# Patient Record
Sex: Male | Born: 1980
Health system: Southern US, Community
[De-identification: ages and names within clinical notes are randomized; demographics above are authoritative.]

## PROBLEM LIST (undated history)

## (undated) DIAGNOSIS — R569 Unspecified convulsions: Secondary | ICD-10-CM

## (undated) HISTORY — PX: GASTRIC BYPASS: SHX52

---

## 2013-03-28 ENCOUNTER — Emergency Department (HOSPITAL_COMMUNITY)
Admission: EM | Admit: 2013-03-28 | Discharge: 2013-03-28 | Disposition: A | Discharge status: Home / Self Care | Payer: No Typology Code available for payment source | Attending: Emergency Medicine | Admitting: Emergency Medicine

## 2013-03-28 ENCOUNTER — Encounter (HOSPITAL_COMMUNITY): Payer: Self-pay | Admitting: Emergency Medicine

## 2013-03-28 ENCOUNTER — Emergency Department (HOSPITAL_COMMUNITY): Payer: No Typology Code available for payment source

## 2013-03-28 DIAGNOSIS — S93409A Sprain of unspecified ligament of unspecified ankle, initial encounter: Secondary | ICD-10-CM | POA: Insufficient documentation

## 2013-03-28 DIAGNOSIS — Y9389 Activity, other specified: Secondary | ICD-10-CM | POA: Insufficient documentation

## 2013-03-28 DIAGNOSIS — Y92410 Unspecified street and highway as the place of occurrence of the external cause: Secondary | ICD-10-CM | POA: Insufficient documentation

## 2013-03-28 DIAGNOSIS — Z880 Allergy status to penicillin: Secondary | ICD-10-CM | POA: Insufficient documentation

## 2013-03-28 DIAGNOSIS — S80211A Abrasion, right knee, initial encounter: Secondary | ICD-10-CM

## 2013-03-28 DIAGNOSIS — IMO0002 Reserved for concepts with insufficient information to code with codable children: Secondary | ICD-10-CM | POA: Insufficient documentation

## 2013-03-28 DIAGNOSIS — Z8669 Personal history of other diseases of the nervous system and sense organs: Secondary | ICD-10-CM | POA: Insufficient documentation

## 2013-03-28 DIAGNOSIS — Z23 Encounter for immunization: Secondary | ICD-10-CM | POA: Insufficient documentation

## 2013-03-28 HISTORY — DX: Unspecified convulsions: R56.9

## 2013-03-28 MED ORDER — IBUPROFEN 600 MG PO TABS: 600.0000 mg | ORAL_TABLET | Freq: Four times a day (QID) | ORAL | Status: DC | PRN

## 2013-03-28 MED ORDER — HYDROCODONE-ACETAMINOPHEN 5-325 MG PO TABS: 1.0000 | ORAL_TABLET | Freq: Four times a day (QID) | ORAL | Status: DC | PRN

## 2013-03-28 MED ORDER — TETANUS-DIPHTH-ACELL PERTUSSIS 5-2.5-18.5 LF-MCG/0.5 IM SUSP
0.5000 mL | Freq: Once | INTRAMUSCULAR | Status: AC
  Administered 2013-03-28: 0.5 mL via INTRAMUSCULAR
  Filled 2013-03-28: qty 0.5
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
## 2013-03-28 NOTE — ED Provider Notes (Signed)
Medical screening examination/treatment/procedure(s) were performed by non-physician practitioner and as supervising physician I was immediately available for consultation/collaboration.  EKG Interpretation   None        Richell Corker T Klyn Kroening, MD 03/28/13 2329 
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
## 2013-03-28 NOTE — Discharge Instructions (Signed)
Abrasion An abrasion is a cut or scrape of the skin. Abrasions do not extend through all layers of the skin and most heal within 10 days. It is important to care for your abrasion properly to prevent infection. CAUSES  Most abrasions are caused by falling on, or gliding across, the ground or other surface. When your skin rubs on something, the outer and inner layer of skin rubs off, causing an abrasion. DIAGNOSIS  Your caregiver will be able to diagnose an abrasion during a physical exam.  TREATMENT  Your treatment depends on how large and deep the abrasion is. Generally, your abrasion will be cleaned with water and a mild soap to remove any dirt or debris. An antibiotic ointment may be put over the abrasion to prevent an infection. A bandage (dressing) may be wrapped around the abrasion to keep it from getting dirty.  You may need a tetanus shot if:  You cannot remember when you had your last tetanus shot.  You have never had a tetanus shot.  The injury broke your skin. If you get a tetanus shot, your arm may swell, get red, and feel warm to the touch. This is common and not a problem. If you need a tetanus shot and you choose not to have one, there is a rare chance of getting tetanus. Sickness from tetanus can be serious.  HOME CARE INSTRUCTIONS   If a dressing was applied, change it at least once a day or as directed by your caregiver. If the bandage sticks, soak it off with warm water.   Wash the area with water and a mild soap to remove all the ointment 2 times a day. Rinse off the soap and pat the area dry with a clean towel.   Reapply any ointment as directed by your caregiver. This will help prevent infection and keep the bandage from sticking. Use gauze over the wound and under the dressing to help keep the bandage from sticking.   Change your dressing right away if it becomes wet or dirty.   Only take over-the-counter or prescription medicines for pain, discomfort, or fever as  directed by your caregiver.   Follow up with your caregiver within 24 48 hours for a wound check, or as directed. If you were not given a wound-check appointment, look closely at your abrasion for redness, swelling, or pus. These are signs of infection. SEEK IMMEDIATE MEDICAL CARE IF:   You have increasing pain in the wound.   You have redness, swelling, or tenderness around the wound.   You have pus coming from the wound.   You have a fever or persistent symptoms for more than 2 3 days.  You have a fever and your symptoms suddenly get worse.  You have a bad smell coming from the wound or dressing.  MAKE SURE YOU:   Understand these instructions.  Will watch your condition.  Will get help right away if you are not doing well or get worse. Document Released: 11/14/2004 Document Revised: 01/22/2012 Document Reviewed: 01/08/2011 [facility] Patient Information 2014 [place], [place].  Acute Ankle Sprain with Phase I Rehab An acute ankle sprain is a partial or complete tear in one or more of the ligaments of the ankle due to traumatic injury. The severity of the injury depends on both the the number of ligaments sprained and the grade of sprain. There are 3 grades of sprains.   A grade 1 sprain is a mild sprain. There is a slight pull without  obvious tearing. There is no loss of strength, and the muscle and ligament are the correct length.  A grade 2 sprain is a moderate sprain. There is tearing of fibers within the substance of the ligament where it connects two bones or two cartilages. The length of the ligament is increased, and there is usually decreased strength.  A grade 3 sprain is a complete rupture of the ligament and is uncommon. In addition to the grade of sprain, there are three types of ankle sprains.  Lateral ankle sprains: This is a sprain of one or more of the three ligaments on the outer side (lateral) of the ankle. These are the most common sprains. Medial ankle  sprains: There is one large triangular ligament of the inner side (medial) of the ankle that is susceptible to injury. Medial ankle sprains are less common. Syndesmosis, "high ankle," sprains: The syndesmosis is the ligament that connects the two bones of the lower leg. Syndesmosis sprains usually only occur with very severe ankle sprains. SYMPTOMS  Pain, tenderness, and swelling in the ankle, starting at the side of injury that may progress to the whole ankle and foot with time.  "Pop" or tearing sensation at the time of injury.  Bruising that may spread to the heel.  Impaired ability to walk soon after injury. CAUSES   Acute ankle sprains are caused by trauma placed on the ankle that temporarily forces or pries the anklebone (talus) out of its normal socket.  Stretching or tearing of the ligaments that normally hold the joint in place (usually due to a twisting injury). RISK INCREASES WITH:  Previous ankle sprain.  Sports in which the foot may land awkwardly (ie. basketball, volleyball, or soccer) or walking or running on uneven or rough surfaces.  Shoes with inadequate support to prevent sideways motion when stress occurs.  Poor strength and flexibility.  Poor balance skills.  Contact sports. PREVENTION   Warm up and stretch properly before activity.  Maintain physical fitness:  Ankle and leg flexibility, muscle strength, and endurance.  Cardiovascular fitness.  Balance training activities.  Use proper technique and have a coach correct improper technique.  Taping, protective strapping, bracing, or high-top tennis shoes may help prevent injury. Initially, tape is best; however, it loses most of its support function within 10 to 15 minutes.  Wear proper fitted protective shoes (High-top shoes with taping or bracing is more effective than either alone).  Provide the ankle with support during sports and practice activities for 12 months following injury. PROGNOSIS    If treated properly, ankle sprains can be expected to recover completely; however, the length of recovery depends on the degree of injury.  A grade 1 sprain usually heals enough in 5 to 7 days to allow modified activity and requires an average of 6 weeks to heal completely.  A grade 2 sprain requires 6 to 10 weeks to heal completely.  A grade 3 sprain requires 12 to 16 weeks to heal.  A syndesmosis sprain often takes more than 3 months to heal. RELATED COMPLICATIONS   Frequent recurrence of symptoms may result in a chronic problem. Appropriately addressing the problem the first time decreases the frequency of recurrence and optimizes healing time. Severity of the initial sprain does not predict the likelihood of later instability.  Injury to other structures (bone, cartilage, or tendon).  A chronically unstable or arthritic ankle joint is a possiblity with repeated sprains. TREATMENT Treatment initially involves the use of ice, medication, and compression bandages  to help reduce pain and inflammation. Ankle sprains are usually immobilized in a walking cast or boot to allow for healing. Crutches may be recommended to reduce pressure on the injury. After immobilization, strengthening and stretching exercises may be necessary to regain strength and a full range of motion. Surgery is rarely needed to treat ankle sprains. MEDICATION   Nonsteroidal anti-inflammatory medications, such as aspirin and ibuprofen (do not take for the first 3 days after injury or within 7 days before surgery), or other minor pain relievers, such as acetaminophen, are often recommended. Take these as directed by your caregiver. Contact your caregiver immediately if any bleeding, stomach upset, or signs of an allergic reaction occur from these medications.  Ointments applied to the skin may be helpful.  Pain relievers may be prescribed as necessary by your caregiver. Do not take prescription pain medication for longer  than 4 to 7 days. Use only as directed and only as much as you need. HEAT AND COLD  Cold treatment (icing) is used to relieve pain and reduce inflammation for acute and chronic cases. Cold should be applied for 10 to 15 minutes every 2 to 3 hours for inflammation and pain and immediately after any activity that aggravates your symptoms. Use ice packs or an ice massage.  Heat treatment may be used before performing stretching and strengthening activities prescribed by your caregiver. Use a heat pack or a warm soak. SEEK IMMEDIATE MEDICAL CARE IF:   Pain, swelling, or bruising worsens despite treatment.  You experience pain, numbness, discoloration, or coldness in the foot or toes.  New, unexplained symptoms develop (drugs used in treatment may produce side effects.) EXERCISES  PHASE I EXERCISES RANGE OF MOTION (ROM) AND STRETCHING EXERCISES - Ankle Sprain, Acute Phase I, Weeks 1 to 2 These exercises may help you when beginning to restore flexibility in your ankle. You will likely work on these exercises for the 1 to 2 weeks after your injury. Once your physician, physical therapist, or athletic trainer sees adequate progress, he or she will advance your exercises. While completing these exercises, remember:   Restoring tissue flexibility helps normal motion to return to the joints. This allows healthier, less painful movement and activity.  An effective stretch should be held for at least 30 seconds.  A stretch should never be painful. You should only feel a gentle lengthening or release in the stretched tissue. RANGE OF MOTION - Dorsi/Plantar Flexion  While sitting with your right / left knee straight, draw the top of your foot upwards by flexing your ankle. Then reverse the motion, pointing your toes downward.  Hold each position for __________ seconds.  After completing your first set of exercises, repeat this exercise with your knee bent. Repeat __________ times. Complete this  exercise __________ times per day.  RANGE OF MOTION - Ankle Alphabet  Imagine your right / left big toe is a pen.  Keeping your hip and knee still, write out the entire alphabet with your "pen." Make the letters as large as you can without increasing any discomfort. Repeat __________ times. Complete this exercise __________ times per day.  STRENGTHENING EXERCISES - Ankle Sprain, Acute -Phase I, Weeks 1 to 2 These exercises may help you when beginning to restore strength in your ankle. You will likely work on these exercises for 1 to 2 weeks after your injury. Once your physician, physical therapist, or athletic trainer sees adequate progress, he or she will advance your exercises. While completing these exercises, remember:  Muscles can gain both the endurance and the strength needed for everyday activities through controlled exercises.  Complete these exercises as instructed by your physician, physical therapist, or athletic trainer. Progress the resistance and repetitions only as guided.  You may experience muscle soreness or fatigue, but the pain or discomfort you are trying to eliminate should never worsen during these exercises. If this pain does worsen, stop and make certain you are following the directions exactly. If the pain is still present after adjustments, discontinue the exercise until you can discuss the trouble with your clinician. STRENGTH - Dorsiflexors  Secure a rubber exercise band/tubing to a fixed object (ie. table, pole) and loop the other end around your right / left foot.  Sit on the floor facing the fixed object. The band/tubing should be slightly tense when your foot is relaxed.  Slowly draw your foot back toward you using your ankle and toes.  Hold this position for __________ seconds. Slowly release the tension in the band and return your foot to the starting position. Repeat __________ times. Complete this exercise __________ times per day.  STRENGTH -  Plantar-flexors   Sit with your right / left leg extended. Holding onto both ends of a rubber exercise band/tubing, loop it around the ball of your foot. Keep a slight tension in the band.  Slowly push your toes away from you, pointing them downward.  Hold this position for __________ seconds. Return slowly, controlling the tension in the band/tubing. Repeat __________ times. Complete this exercise __________ times per day.  STRENGTH - Ankle Eversion  Secure one end of a rubber exercise band/tubing to a fixed object (table, pole). Loop the other end around your foot just before your toes.  Place your fists between your knees. This will focus your strengthening at your ankle.  Drawing the band/tubing across your opposite foot, slowly, pull your little toe out and up. Make sure the band/tubing is positioned to resist the entire motion.  Hold this position for __________ seconds. Have your muscles resist the band/tubing as it slowly pulls your foot back to the starting position.  Repeat __________ times. Complete this exercise __________ times per day.  STRENGTH - Ankle Inversion  Secure one end of a rubber exercise band/tubing to a fixed object (table, pole). Loop the other end around your foot just before your toes.  Place your fists between your knees. This will focus your strengthening at your ankle.  Slowly, pull your big toe up and in, making sure the band/tubing is positioned to resist the entire motion.  Hold this position for __________ seconds.  Have your muscles resist the band/tubing as it slowly pulls your foot back to the starting position. Repeat __________ times. Complete this exercises __________ times per day.  STRENGTH - Towel Curls  Sit in a chair positioned on a non-carpeted surface.  Place your right / left foot on a towel, keeping your heel on the floor.  Pull the towel toward your heel by only curling your toes. Keep your heel on the floor.  If instructed  by your physician, physical therapist, or athletic trainer, add weight to the end of the towel. Repeat ______5____ times. Complete this exercise ___2_______ times per day. Document Released: 09/05/2004 Document Revised: 04/29/2011 Document Reviewed: 05/19/2008 [facility] Patient Information 2014 [place], [place]. No fracture seen on xray
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
## 2016-02-05 DIAGNOSIS — R002 Palpitations: Secondary | ICD-10-CM | POA: Diagnosis not present

## 2016-02-06 DIAGNOSIS — R002 Palpitations: Secondary | ICD-10-CM | POA: Diagnosis not present

## 2016-02-07 DIAGNOSIS — R002 Palpitations: Secondary | ICD-10-CM | POA: Diagnosis not present

## 2016-02-07 DIAGNOSIS — Z6838 Body mass index (BMI) 38.0-38.9, adult: Secondary | ICD-10-CM | POA: Diagnosis not present

## 2017-02-12 ENCOUNTER — Other Ambulatory Visit: Payer: Self-pay

## 2017-02-12 ENCOUNTER — Encounter (HOSPITAL_BASED_OUTPATIENT_CLINIC_OR_DEPARTMENT_OTHER): Payer: Self-pay

## 2017-02-12 DIAGNOSIS — M549 Dorsalgia, unspecified: Secondary | ICD-10-CM | POA: Diagnosis not present

## 2017-02-12 DIAGNOSIS — R0602 Shortness of breath: Secondary | ICD-10-CM | POA: Diagnosis not present

## 2017-02-12 NOTE — ED Triage Notes (Signed)
Pt c/o left upper back pain worsening for over a week that is worse with movement, has not tried anything for pain

## 2017-02-13 ENCOUNTER — Emergency Department (HOSPITAL_BASED_OUTPATIENT_CLINIC_OR_DEPARTMENT_OTHER)
Admission: EM | Admit: 2017-02-13 | Discharge: 2017-02-13 | Disposition: A | Discharge status: Home / Self Care | Payer: BLUE CROSS/BLUE SHIELD | Attending: Emergency Medicine | Admitting: Emergency Medicine

## 2017-02-13 ENCOUNTER — Emergency Department (HOSPITAL_BASED_OUTPATIENT_CLINIC_OR_DEPARTMENT_OTHER): Payer: BLUE CROSS/BLUE SHIELD

## 2017-02-13 DIAGNOSIS — M549 Dorsalgia, unspecified: Secondary | ICD-10-CM

## 2017-02-13 DIAGNOSIS — R0602 Shortness of breath: Secondary | ICD-10-CM | POA: Diagnosis not present

## 2017-02-13 MED ORDER — NAPROXEN 250 MG PO TABS
500.0000 mg | ORAL_TABLET | Freq: Once | ORAL | Status: AC
  Administered 2017-02-13: 500 mg via ORAL
  Filled 2017-02-13: qty 2

## 2017-02-13 MED ORDER — NAPROXEN 500 MG PO TABS: 500.0000 mg | ORAL_TABLET | Freq: Two times a day (BID) | ORAL | 0 refills | Status: DC | PRN

## 2017-02-13 NOTE — ED Notes (Signed)
Pt c/o left shoulder blade pain x 9 days  Pain increased w movement and deep breaths

## 2017-02-13 NOTE — ED Provider Notes (Signed)
[facility] Provider Note: Jonathan DellJ. Lane [name], MD, FACEP  CSN: [id number] MRN: [id number] ARRIVAL: 02/12/17 at 2301 ROOM: [facility]03/[facility]03   CHIEF COMPLAINT  Back Pain   HISTORY OF PRESENT ILLNESS  02/13/17 12:40 AM Jonathan Fuentes is a 36 y.o. male with a one-week history of pain in his left upper back.  He describes the pain as feeling like a pulled muscle.  He rates his pain as a 4 out of 10.  It is worse with deep breathing or with movements of his left upper back.  He denies recent trauma or injury.  He has not taken anything for the pain.  He states he has difficulty finding a comfortable position to sleep in.  [facility] [place] reveals the patient has received no opioid prescriptions in the past year.   Past Medical History:  Diagnosis Date  . Seizures (HCC)    last one was in 2010    History reviewed. No pertinent surgical history.  No family history on file.  Social History   Tobacco Use  . Smoking status: Never Smoker  . Smokeless tobacco: Never Used  Substance Use Topics  . Alcohol use: Yes    Comment: socially  . Drug use: No    Prior to Admission medications   Medication Sig Start Date End Date Taking? Authorizing Provider  HYDROcodone-acetaminophen (NORCO/VICODIN) 5-325 MG per tablet Take 1 tablet by mouth every 6 (six) hours as needed. 03/28/13   Earley FavorSchulz, Gail, NP  ibuprofen (ADVIL,MOTRIN) 600 MG tablet Take 1 tablet (600 mg total) by mouth every 6 (six) hours as needed. 03/28/13   Earley FavorSchulz, Gail, NP    Allergies Penicillins and Tegretol [carbamazepine]   REVIEW OF SYSTEMS  Negative except as noted here or in the History of Present Illness.   PHYSICAL EXAMINATION  Initial Vital Signs Blood pressure (!) 161/89, pulse 71, temperature 98.1 F (36.7 C), temperature source Oral, resp. rate 16, height 5\' 11"  (1.803 m), weight 129.3 kg (285 lb), SpO2 100 %.  Examination General: Well-developed,  well-nourished male in no acute distress; appearance consistent with age of record HENT: normocephalic; atraumatic Eyes: Normal appearance Neck: supple Heart: regular rate and rhythm Lungs: clear to auscultation bilaterally Abdomen: soft; nondistended; nontender; bowel sounds present Back: Nontender; mild pain on movement of left upper back and left shoulder Extremities: No deformity; full range of motion; pulses normal; no edema Neurologic: Awake, alert and oriented; motor function intact in all extremities and symmetric; no facial droop Skin: Warm and dry Psychiatric: Normal mood and affect   RESULTS  Summary of this visit's results, reviewed by myself:   EKG Interpretation  Date/Time:    Ventricular Rate:    PR Interval:    QRS Duration:   QT Interval:    QTC Calculation:   R Axis:     Text Interpretation:        Laboratory Studies: No results found for this or any previous visit (from the past 24 hour(s)). Imaging Studies: Dg Chest 2 View  Result Date: 02/13/2017 CLINICAL DATA:  Acute onset of shortness of breath and left upper back pain. EXAM: CHEST  2 VIEW COMPARISON:  None. FINDINGS: The lungs are well-aerated and clear. There is no evidence of focal opacification, pleural effusion or pneumothorax. The heart is normal in size; the mediastinal contour is within normal limits. No acute osseous abnormalities are seen. IMPRESSION: No acute cardiopulmonary process seen. Electronically Signed   By: Beryle BeamsJeffery  Chang M.D.  On: 02/13/2017 01:23    ED COURSE  Nursing notes and initial vitals signs, including pulse oximetry, reviewed.  Vitals:   02/12/17 2307  BP: (!) 161/89  Pulse: 71  Resp: 16  Temp: 98.1 F (36.7 C)  TempSrc: Oral  SpO2: 100%  Weight: 129.3 kg (285 lb)  Height: 5\' 11"  (1.803 m)    PROCEDURES    ED DIAGNOSES     ICD-10-CM   1. Upper back pain on left side M54.9        [name], Jonathan RuizJohn, MD 02/13/17 [phone]

## 2017-04-03 DIAGNOSIS — Z13220 Encounter for screening for lipoid disorders: Secondary | ICD-10-CM | POA: Diagnosis not present

## 2017-04-03 DIAGNOSIS — Z0000 Encounter for general adult medical examination without abnormal findings: Secondary | ICD-10-CM | POA: Diagnosis not present

## 2017-07-10 DIAGNOSIS — Z6841 Body Mass Index (BMI) 40.0 and over, adult: Secondary | ICD-10-CM | POA: Diagnosis not present

## 2017-07-10 DIAGNOSIS — Z01818 Encounter for other preprocedural examination: Secondary | ICD-10-CM | POA: Diagnosis not present

## 2017-08-11 DIAGNOSIS — Z880 Allergy status to penicillin: Secondary | ICD-10-CM | POA: Diagnosis not present

## 2017-08-11 DIAGNOSIS — Z888 Allergy status to other drugs, medicaments and biological substances status: Secondary | ICD-10-CM | POA: Diagnosis not present

## 2017-08-11 DIAGNOSIS — Z01818 Encounter for other preprocedural examination: Secondary | ICD-10-CM | POA: Diagnosis not present

## 2017-09-08 DIAGNOSIS — R319 Hematuria, unspecified: Secondary | ICD-10-CM | POA: Diagnosis not present

## 2017-09-25 DIAGNOSIS — Z713 Dietary counseling and surveillance: Secondary | ICD-10-CM | POA: Diagnosis not present

## 2017-09-25 DIAGNOSIS — Z01818 Encounter for other preprocedural examination: Secondary | ICD-10-CM | POA: Diagnosis not present

## 2017-10-27 DIAGNOSIS — Z3141 Encounter for fertility testing: Secondary | ICD-10-CM | POA: Diagnosis not present

## 2017-11-03 DIAGNOSIS — Z713 Dietary counseling and surveillance: Secondary | ICD-10-CM | POA: Diagnosis not present

## 2017-11-03 DIAGNOSIS — Z6841 Body Mass Index (BMI) 40.0 and over, adult: Secondary | ICD-10-CM | POA: Diagnosis not present

## 2017-12-08 DIAGNOSIS — Z713 Dietary counseling and surveillance: Secondary | ICD-10-CM | POA: Diagnosis not present

## 2017-12-08 DIAGNOSIS — Z6841 Body Mass Index (BMI) 40.0 and over, adult: Secondary | ICD-10-CM | POA: Diagnosis not present

## 2018-01-05 DIAGNOSIS — F4320 Adjustment disorder, unspecified: Secondary | ICD-10-CM | POA: Diagnosis not present

## 2018-01-12 DIAGNOSIS — Z713 Dietary counseling and surveillance: Secondary | ICD-10-CM | POA: Diagnosis not present

## 2018-01-12 DIAGNOSIS — Z01818 Encounter for other preprocedural examination: Secondary | ICD-10-CM | POA: Diagnosis not present

## 2018-01-22 DIAGNOSIS — Z01818 Encounter for other preprocedural examination: Secondary | ICD-10-CM | POA: Diagnosis not present

## 2018-01-22 DIAGNOSIS — Z713 Dietary counseling and surveillance: Secondary | ICD-10-CM | POA: Diagnosis not present

## 2018-01-22 DIAGNOSIS — Z6841 Body Mass Index (BMI) 40.0 and over, adult: Secondary | ICD-10-CM | POA: Diagnosis not present

## 2018-02-24 DIAGNOSIS — Z713 Dietary counseling and surveillance: Secondary | ICD-10-CM | POA: Diagnosis not present

## 2018-02-24 DIAGNOSIS — Z6841 Body Mass Index (BMI) 40.0 and over, adult: Secondary | ICD-10-CM | POA: Diagnosis not present

## 2018-02-24 DIAGNOSIS — Z01818 Encounter for other preprocedural examination: Secondary | ICD-10-CM | POA: Diagnosis not present

## 2018-02-26 DIAGNOSIS — Z01818 Encounter for other preprocedural examination: Secondary | ICD-10-CM | POA: Diagnosis not present

## 2018-04-07 DIAGNOSIS — Z713 Dietary counseling and surveillance: Secondary | ICD-10-CM | POA: Diagnosis not present

## 2018-04-07 DIAGNOSIS — Z6841 Body Mass Index (BMI) 40.0 and over, adult: Secondary | ICD-10-CM | POA: Diagnosis not present

## 2018-04-07 DIAGNOSIS — Z01818 Encounter for other preprocedural examination: Secondary | ICD-10-CM | POA: Diagnosis not present

## 2018-04-22 DIAGNOSIS — R000 Tachycardia, unspecified: Secondary | ICD-10-CM | POA: Diagnosis not present

## 2018-04-22 DIAGNOSIS — Z87892 Personal history of anaphylaxis: Secondary | ICD-10-CM | POA: Diagnosis not present

## 2018-04-22 DIAGNOSIS — K219 Gastro-esophageal reflux disease without esophagitis: Secondary | ICD-10-CM | POA: Diagnosis not present

## 2018-04-22 DIAGNOSIS — Z880 Allergy status to penicillin: Secondary | ICD-10-CM | POA: Diagnosis not present

## 2018-04-22 DIAGNOSIS — Z6841 Body Mass Index (BMI) 40.0 and over, adult: Secondary | ICD-10-CM | POA: Diagnosis not present

## 2018-05-05 DIAGNOSIS — E668 Other obesity: Secondary | ICD-10-CM | POA: Diagnosis not present

## 2018-05-05 DIAGNOSIS — Z713 Dietary counseling and surveillance: Secondary | ICD-10-CM | POA: Diagnosis not present

## 2018-05-05 DIAGNOSIS — Z79899 Other long term (current) drug therapy: Secondary | ICD-10-CM | POA: Diagnosis not present

## 2018-05-05 DIAGNOSIS — Z48815 Encounter for surgical aftercare following surgery on the digestive system: Secondary | ICD-10-CM | POA: Diagnosis not present

## 2018-05-05 DIAGNOSIS — Z9884 Bariatric surgery status: Secondary | ICD-10-CM | POA: Diagnosis not present

## 2018-05-05 DIAGNOSIS — R1013 Epigastric pain: Secondary | ICD-10-CM | POA: Diagnosis not present

## 2018-05-05 DIAGNOSIS — Z6839 Body mass index (BMI) 39.0-39.9, adult: Secondary | ICD-10-CM | POA: Diagnosis not present

## 2018-05-06 DIAGNOSIS — R1310 Dysphagia, unspecified: Secondary | ICD-10-CM | POA: Diagnosis not present

## 2018-05-06 DIAGNOSIS — R1013 Epigastric pain: Secondary | ICD-10-CM | POA: Diagnosis not present

## 2018-05-06 DIAGNOSIS — Z9884 Bariatric surgery status: Secondary | ICD-10-CM | POA: Diagnosis not present

## 2018-07-03 DIAGNOSIS — M545 Low back pain: Secondary | ICD-10-CM | POA: Diagnosis not present

## 2018-07-03 DIAGNOSIS — Z6837 Body mass index (BMI) 37.0-37.9, adult: Secondary | ICD-10-CM | POA: Diagnosis not present

## 2018-07-03 DIAGNOSIS — R300 Dysuria: Secondary | ICD-10-CM | POA: Diagnosis not present

## 2018-08-05 DIAGNOSIS — Z6834 Body mass index (BMI) 34.0-34.9, adult: Secondary | ICD-10-CM | POA: Diagnosis not present

## 2018-08-05 DIAGNOSIS — Z713 Dietary counseling and surveillance: Secondary | ICD-10-CM | POA: Diagnosis not present

## 2018-08-05 DIAGNOSIS — Z79899 Other long term (current) drug therapy: Secondary | ICD-10-CM | POA: Diagnosis not present

## 2018-08-05 DIAGNOSIS — Z48815 Encounter for surgical aftercare following surgery on the digestive system: Secondary | ICD-10-CM | POA: Diagnosis not present

## 2018-08-05 DIAGNOSIS — K912 Postsurgical malabsorption, not elsewhere classified: Secondary | ICD-10-CM | POA: Diagnosis not present

## 2018-08-05 DIAGNOSIS — Z9884 Bariatric surgery status: Secondary | ICD-10-CM | POA: Diagnosis not present

## 2018-08-05 DIAGNOSIS — K5900 Constipation, unspecified: Secondary | ICD-10-CM | POA: Diagnosis not present

## 2018-08-13 DIAGNOSIS — N132 Hydronephrosis with renal and ureteral calculous obstruction: Secondary | ICD-10-CM | POA: Diagnosis not present

## 2018-08-13 DIAGNOSIS — R1031 Right lower quadrant pain: Secondary | ICD-10-CM | POA: Diagnosis not present

## 2018-08-13 DIAGNOSIS — N390 Urinary tract infection, site not specified: Secondary | ICD-10-CM | POA: Diagnosis not present

## 2018-08-26 DIAGNOSIS — R1110 Vomiting, unspecified: Secondary | ICD-10-CM | POA: Diagnosis not present

## 2018-08-26 DIAGNOSIS — Z20828 Contact with and (suspected) exposure to other viral communicable diseases: Secondary | ICD-10-CM | POA: Diagnosis not present

## 2018-08-26 DIAGNOSIS — R197 Diarrhea, unspecified: Secondary | ICD-10-CM | POA: Diagnosis not present

## 2018-10-21 ENCOUNTER — Encounter (HOSPITAL_COMMUNITY): Payer: Self-pay | Admitting: Emergency Medicine

## 2018-10-21 ENCOUNTER — Emergency Department (HOSPITAL_COMMUNITY)
Admission: EM | Admit: 2018-10-21 | Discharge: 2018-10-21 | Disposition: A | Discharge status: Home / Self Care | Payer: BC Managed Care – PPO | Attending: Emergency Medicine | Admitting: Emergency Medicine

## 2018-10-21 ENCOUNTER — Emergency Department (HOSPITAL_COMMUNITY): Payer: BC Managed Care – PPO

## 2018-10-21 ENCOUNTER — Other Ambulatory Visit: Payer: Self-pay

## 2018-10-21 DIAGNOSIS — N1330 Unspecified hydronephrosis: Secondary | ICD-10-CM | POA: Diagnosis not present

## 2018-10-21 DIAGNOSIS — Z9884 Bariatric surgery status: Secondary | ICD-10-CM | POA: Insufficient documentation

## 2018-10-21 DIAGNOSIS — R109 Unspecified abdominal pain: Secondary | ICD-10-CM | POA: Insufficient documentation

## 2018-10-21 DIAGNOSIS — R1032 Left lower quadrant pain: Secondary | ICD-10-CM | POA: Diagnosis not present

## 2018-10-21 DIAGNOSIS — N200 Calculus of kidney: Secondary | ICD-10-CM | POA: Diagnosis not present

## 2018-10-21 DIAGNOSIS — R001 Bradycardia, unspecified: Secondary | ICD-10-CM | POA: Diagnosis not present

## 2018-10-21 LAB — CBC
HCT: 47.3 % (ref 39.0–52.0)
Hemoglobin: 15.2 g/dL (ref 13.0–17.0)
MCH: 30.3 pg (ref 26.0–34.0)
MCHC: 32.1 g/dL (ref 30.0–36.0)
MCV: 94.4 fL (ref 80.0–100.0)
Platelets: 257 10*3/uL (ref 150–400)
RBC: 5.01 MIL/uL (ref 4.22–5.81)
RDW: 12.5 % (ref 11.5–15.5)
WBC: 11.3 10*3/uL — ABNORMAL HIGH (ref 4.0–10.5)
nRBC: 0 % (ref 0.0–0.2)

## 2018-10-21 LAB — URINALYSIS, ROUTINE W REFLEX MICROSCOPIC
Bilirubin Urine: NEGATIVE
Glucose, UA: NEGATIVE mg/dL
Hgb urine dipstick: NEGATIVE
Ketones, ur: 80 mg/dL — AB
Leukocytes,Ua: NEGATIVE
Nitrite: NEGATIVE
Protein, ur: 30 mg/dL — AB
Specific Gravity, Urine: 1.029 (ref 1.005–1.030)
pH: 5 (ref 5.0–8.0)

## 2018-10-21 LAB — BASIC METABOLIC PANEL
Anion gap: 10 (ref 5–15)
BUN: 13 mg/dL (ref 6–20)
CO2: 25 mmol/L (ref 22–32)
Calcium: 9.8 mg/dL (ref 8.9–10.3)
Chloride: 105 mmol/L (ref 98–111)
Creatinine, Ser: 1.2 mg/dL (ref 0.61–1.24)
GFR calc Af Amer: >60 mL/min (ref >60)
GFR calc non Af Amer: >60 mL/min (ref >60)
Glucose, Bld: 125 mg/dL — ABNORMAL HIGH (ref 70–99)
Potassium: 3.8 mmol/L (ref 3.5–5.1)
Sodium: 140 mmol/L (ref 135–145)

## 2018-10-21 MED ORDER — OXYCODONE-ACETAMINOPHEN 5-325 MG PO TABS
1.0000 | ORAL_TABLET | Freq: Once | ORAL | Status: AC
  Administered 2018-10-21: 22:00:00 1 via ORAL
  Filled 2018-10-21: qty 1

## 2018-10-21 MED ORDER — SODIUM CHLORIDE 0.9 % IV BOLUS
1000.0000 mL | Freq: Once | INTRAVENOUS | Status: AC
  Administered 2018-10-21: 20:00:00 1000 mL via INTRAVENOUS

## 2018-10-21 MED ORDER — ONDANSETRON 4 MG PO TBDP: 4.0000 mg | ORAL_TABLET | Freq: Three times a day (TID) | ORAL | 0 refills | Status: AC | PRN

## 2018-10-21 MED ORDER — KETOROLAC TROMETHAMINE 30 MG/ML IJ SOLN
30.0000 mg | Freq: Once | INTRAMUSCULAR | Status: AC
  Administered 2018-10-21: 30 mg via INTRAVENOUS
  Filled 2018-10-21: qty 1

## 2018-10-21 MED ORDER — CIPROFLOXACIN HCL 250 MG PO TABS
500.0000 mg | ORAL_TABLET | Freq: Once | ORAL | Status: AC
  Administered 2018-10-21: 500 mg via ORAL
  Filled 2018-10-21: qty 2

## 2018-10-21 MED ORDER — MORPHINE SULFATE (PF) 4 MG/ML IV SOLN
4.0000 mg | Freq: Once | INTRAVENOUS | Status: AC
  Administered 2018-10-21: 20:00:00 4 mg via INTRAVENOUS
  Filled 2018-10-21: qty 1

## 2018-10-21 MED ORDER — ONDANSETRON HCL 4 MG/2ML IJ SOLN
4.0000 mg | Freq: Once | INTRAMUSCULAR | Status: AC
  Administered 2018-10-21: 4 mg via INTRAVENOUS
  Filled 2018-10-21: qty 2

## 2018-10-21 MED ORDER — OXYCODONE-ACETAMINOPHEN 5-325 MG PO TABS: 1.0000 | ORAL_TABLET | Freq: Four times a day (QID) | ORAL | 0 refills | Status: AC | PRN

## 2018-10-21 MED ORDER — CIPROFLOXACIN HCL 500 MG PO TABS: 500.0000 mg | ORAL_TABLET | Freq: Two times a day (BID) | ORAL | 0 refills | Status: AC

## 2018-10-21 NOTE — ED Notes (Signed)
Patient heart rate 44. Post bariatric surgery in March. EKG completed in Triage.

## 2018-10-21 NOTE — ED Triage Notes (Signed)
Patient reports L flank pain and vomiting that started an hour ago.

## 2018-10-21 NOTE — ED Provider Notes (Addendum)
[facility] EMERGENCY DEPARTMENT Provider Note   CSN: [id number] Arrival date & time: 10/21/18  1445     History   Chief Complaint Chief Complaint  Patient presents with   Flank Pain    HPI Jonathan Fuentes is a 38 y.o. male past medical history of gastric bypass surgery, nephrolithiasis, presenting to the emergency department with complaint of left-sided flank pain that began around 1 PM this afternoon.  He states he began having nausea vomiting soon after the pain began.  His pain feels very similar to prior episode of kidney stones when he was diagnosed last month with a right ureteral stone.  He was seen at [facility] for this.  He endorses increased urinary frequency.  Denies dysuria, fever, hematuria, abdominal pain.     The history is provided by the patient.    Past Medical History:  Diagnosis Date   Seizures (HCC)    last one was in 2010    There are no active problems to display for this patient.   Past Surgical History:  Procedure Laterality Date   GASTRIC BYPASS          Home Medications    Prior to Admission medications   Medication Sig Start Date End Date Taking? Authorizing Provider  Multiple Vitamin (MULTIVITAMIN WITH MINERALS) TABS tablet Take 1 tablet by mouth daily.   Yes [provider]  ciprofloxacin (CIPRO) 500 MG tablet Take 1 tablet (500 mg total) by mouth 2 (two) times daily. 10/22/18   Taquilla Downum, [place] N, PA-C  ondansetron (ZOFRAN ODT) 4 MG disintegrating tablet Take 1 tablet (4 mg total) by mouth every 8 (eight) hours as needed for nausea or vomiting. 10/21/18   Merland Holness, [place] N, PA-C  oxyCODONE-acetaminophen (PERCOCET/ROXICET) 5-325 MG tablet Take 1 tablet by mouth every 6 (six) hours as needed for severe pain. 10/21/18   Lacora Folmer, [place] N, PA-C    Family History Family History  Problem Relation Age of Onset   Hypertension Father    Diabetes Father     Social History Social History   Tobacco Use   Smoking status: Never  Smoker   Smokeless tobacco: Never Used  Substance Use Topics   Alcohol use: Not Currently    Comment: socially   Drug use: No     Allergies   Penicillins and Tegretol [carbamazepine]   Review of Systems Review of Systems  Constitutional: Negative for fever.  Gastrointestinal: Positive for nausea and vomiting.  Genitourinary: Positive for flank pain and frequency. Negative for dysuria and hematuria.  All other systems reviewed and are negative.    Physical Exam Updated Vital Signs BP (!) 117/59 (BP Location: Right Arm)    Pulse 67    Temp 97.7 F (36.5 C) (Oral)    Resp 18    SpO2 100%   Physical Exam Vitals signs and nursing note reviewed.  Constitutional:      General: He is not in acute distress.    Appearance: He is well-developed. He is not ill-appearing.  HENT:     Head: Normocephalic and atraumatic.  Eyes:     Conjunctiva/sclera: Conjunctivae normal.  Cardiovascular:     Rate and Rhythm: Normal rate and regular rhythm.  Pulmonary:     Effort: Pulmonary effort is normal. No respiratory distress.     Breath sounds: Normal breath sounds.  Abdominal:     General: Bowel sounds are normal.     Palpations: Abdomen is soft.     Tenderness: There is abdominal  tenderness in the suprapubic area and left lower quadrant. There is left CVA tenderness. There is no guarding or rebound.  Skin:    General: Skin is warm.  Neurological:     Mental Status: He is alert.  Psychiatric:        Behavior: Behavior normal.      ED Treatments / Results  Labs (all labs ordered are listed, but only abnormal results are displayed) Labs Reviewed  URINALYSIS, ROUTINE W REFLEX MICROSCOPIC - Abnormal; Notable for the following components:      Result Value   APPearance HAZY (*)    Ketones, ur 80 (*)    Protein, ur 30 (*)    Bacteria, UA RARE (*)    All other components within normal limits  CBC - Abnormal; Notable for the following components:   WBC 11.3 (*)    All other  components within normal limits  BASIC METABOLIC PANEL - Abnormal; Notable for the following components:   Glucose, Bld 125 (*)    All other components within normal limits    EKG EKG Interpretation  Date/Time:  Wednesday October 21 2018 15:11:00 EDT Ventricular Rate:  43 PR Interval:  160 QRS Duration: 94 QT Interval:  478 QTC Calculation: 403 R Axis:   48 Text Interpretation:  Marked sinus bradycardia Abnormal ECG No old tracing to compare Confirmed by Meridee ScoreButler, Johannes [phone]([id number]) on 10/21/2018 3:19:23 PM   Radiology Ct Renal Stone Study  Result Date: 10/21/2018 CLINICAL DATA:  Left-sided flank pain EXAM: CT ABDOMEN AND PELVIS WITHOUT CONTRAST TECHNIQUE: Multidetector CT imaging of the abdomen and pelvis was performed following the standard protocol without IV contrast. COMPARISON:  CT 08/13/2018 FINDINGS: Lower chest: Lung bases demonstrate no acute consolidation or effusion. The heart size is normal. Hepatobiliary: No focal liver abnormality is seen. No gallstones, gallbladder wall thickening, or biliary dilatation. Pancreas: Unremarkable. No pancreatic ductal dilatation or surrounding inflammatory changes. Spleen: Normal in size without focal abnormality. Adrenals/Urinary Tract: Adrenal glands are within normal limits. No right hydronephrosis. There is mild left hydronephrosis and hydroureter. Perinephric edema. No ureteral stone identified. 3 mm stone mid pole left kidney with punctate stone in the mid to upper pole. Urinary bladder is unremarkable Stomach/Bowel: Status post gastric bypass. Appendix appears normal. No evidence of bowel wall thickening, distention, or inflammatory changes. Scattered sigmoid colon diverticula. Vascular/Lymphatic: No significant vascular findings are present. No enlarged abdominal or pelvic lymph nodes. Reproductive: Prostate is unremarkable. Other: Negative for free air or free fluid Musculoskeletal: No acute or significant osseous findings. IMPRESSION: 1. Left  perinephric edema with mild left hydronephrosis and hydroureter but no definitive ureteral stone. Findings could be secondary to recently passed stone or possibly ascending urinary tract infection. There remains a 3 mm stone lower pole left kidney with additional punctate stone in the mid to upper pole of the left kidney. Electronically Signed   By: Jasmine PangKim  Fujinaga M.D.   On: 10/21/2018 21:30    Procedures Procedures (including critical care time)  Medications Ordered in ED Medications  ketorolac (TORADOL) 30 MG/ML injection 30 mg (has no administration in time range)  ciprofloxacin (CIPRO) tablet 500 mg (has no administration in time range)  oxyCODONE-acetaminophen (PERCOCET/ROXICET) 5-325 MG per tablet 1 tablet (has no administration in time range)  sodium chloride 0.9 % bolus 1,000 mL (1,000 mLs Intravenous New Bag/Given 10/21/18 1943)  ondansetron (ZOFRAN) injection 4 mg (4 mg Intravenous Given 10/21/18 1943)  morphine 4 MG/ML injection 4 mg (4 mg Intravenous Given 10/21/18 1943)  Initial Impression / Assessment and Plan / ED Course  I have reviewed the triage vital signs and the nursing notes.  Pertinent labs & imaging results that were available during my care of the patient were reviewed by me and considered in my medical decision making (see chart for details).  Clinical Course as of Oct 21 2210  Wed Oct 21, 2018  2155 Patient reports improvement in symptoms.  Discussed CT results and work-up today.  Will cover for possible UTI versus Pyelo.  Culture sent.   [JR]    Clinical Course User Index [JR] Anaya Bovee, [place] N, PA-C       Patient with history of nephrolithiasis, presenting to the emergency department with left-sided flank pain and nausea vomiting that began suddenly this afternoon.  He reports increased urinary frequency though no dysuria.  On evaluation he is in no apparent distress, well-appearing, afebrile.  He has left CVA tenderness and some very mild left lower  quadrant/suprapubic discomfort with palpation.  Labs and urine sent.  IV fluids, pain medication, antiemetics ordered.  UA is negative for blood and shows rare bacteria though normal white cells, negative leuks and negative nitrites.  Slight leukocytosis of 11.3. Normal Cr. We will proceed with CT scan for further.   Scan with findings consistent with either recently passed stone or ascending UTI.  On reevaluation patient is feeling much better.  Discussed results and plan to cover with antibiotics for possibility of pyelonephritis given patient's flank pain and nausea and vomiting.  He will also be prescribed pain medication and nausea medication for symptom control.  I provided a urology referral for follow-up as this is his second occurrence possible nephrolithiasis.  He was also found to have small stones in the left kidney he may pass in the future.  Instructed strict return precautions including fever or worsening symptoms.  Discussed results, findings, treatment and follow up. Patient advised of return precautions. Patient verbalized understanding and agreed with plan.  [place] Controlled Substance reporting System queried  Final Clinical Impressions(s) / ED Diagnoses   Final diagnoses:  Left flank pain  Nephrolithiasis    ED Discharge Orders         Ordered    ciprofloxacin (CIPRO) 500 MG tablet  2 times daily     10/21/18 2202    oxyCODONE-acetaminophen (PERCOCET/ROXICET) 5-325 MG tablet  Every 6 hours PRN     10/21/18 2202    ondansetron (ZOFRAN ODT) 4 MG disintegrating tablet  Every 8 hours PRN     10/21/18 2202           Mukesh Kornegay, [place] N, PA-C 10/21/18 2216    Hettie Roselli, [place] N, PA-C 10/21/18 2217    Hayden Rasmussen, MD 10/22/18 [phone]

## 2018-10-21 NOTE — Discharge Instructions (Addendum)
Please read instructions below. Drink plenty of water. Starting tomorrow, take the antibiotic, ciprofloxacin, every 12 hours until gone. You can take Percocet every 6 hours as needed for pain. You can take Zofran every 6 hours as needed for nausea. Schedule an appointment with urology to follow-up on your diagnosis of kidney stones. Return to the ER for fever, chills, uncontrollable vomiting, or worsening symptoms.

## 2018-10-23 LAB — URINE CULTURE: Culture: NO GROWTH

## 2018-11-02 DIAGNOSIS — N200 Calculus of kidney: Secondary | ICD-10-CM | POA: Diagnosis not present

## 2018-11-02 DIAGNOSIS — N130 Hydronephrosis with ureteropelvic junction obstruction: Secondary | ICD-10-CM | POA: Diagnosis not present

## 2018-11-30 IMAGING — DX DG CHEST 2V
2 series · 2 of 2 positions shown · non-contrast
Comparison: None.

CLINICAL DATA: Acute onset of shortness of breath and left upper
back pain.

EXAM:
CHEST  2 VIEW

[chest pa]
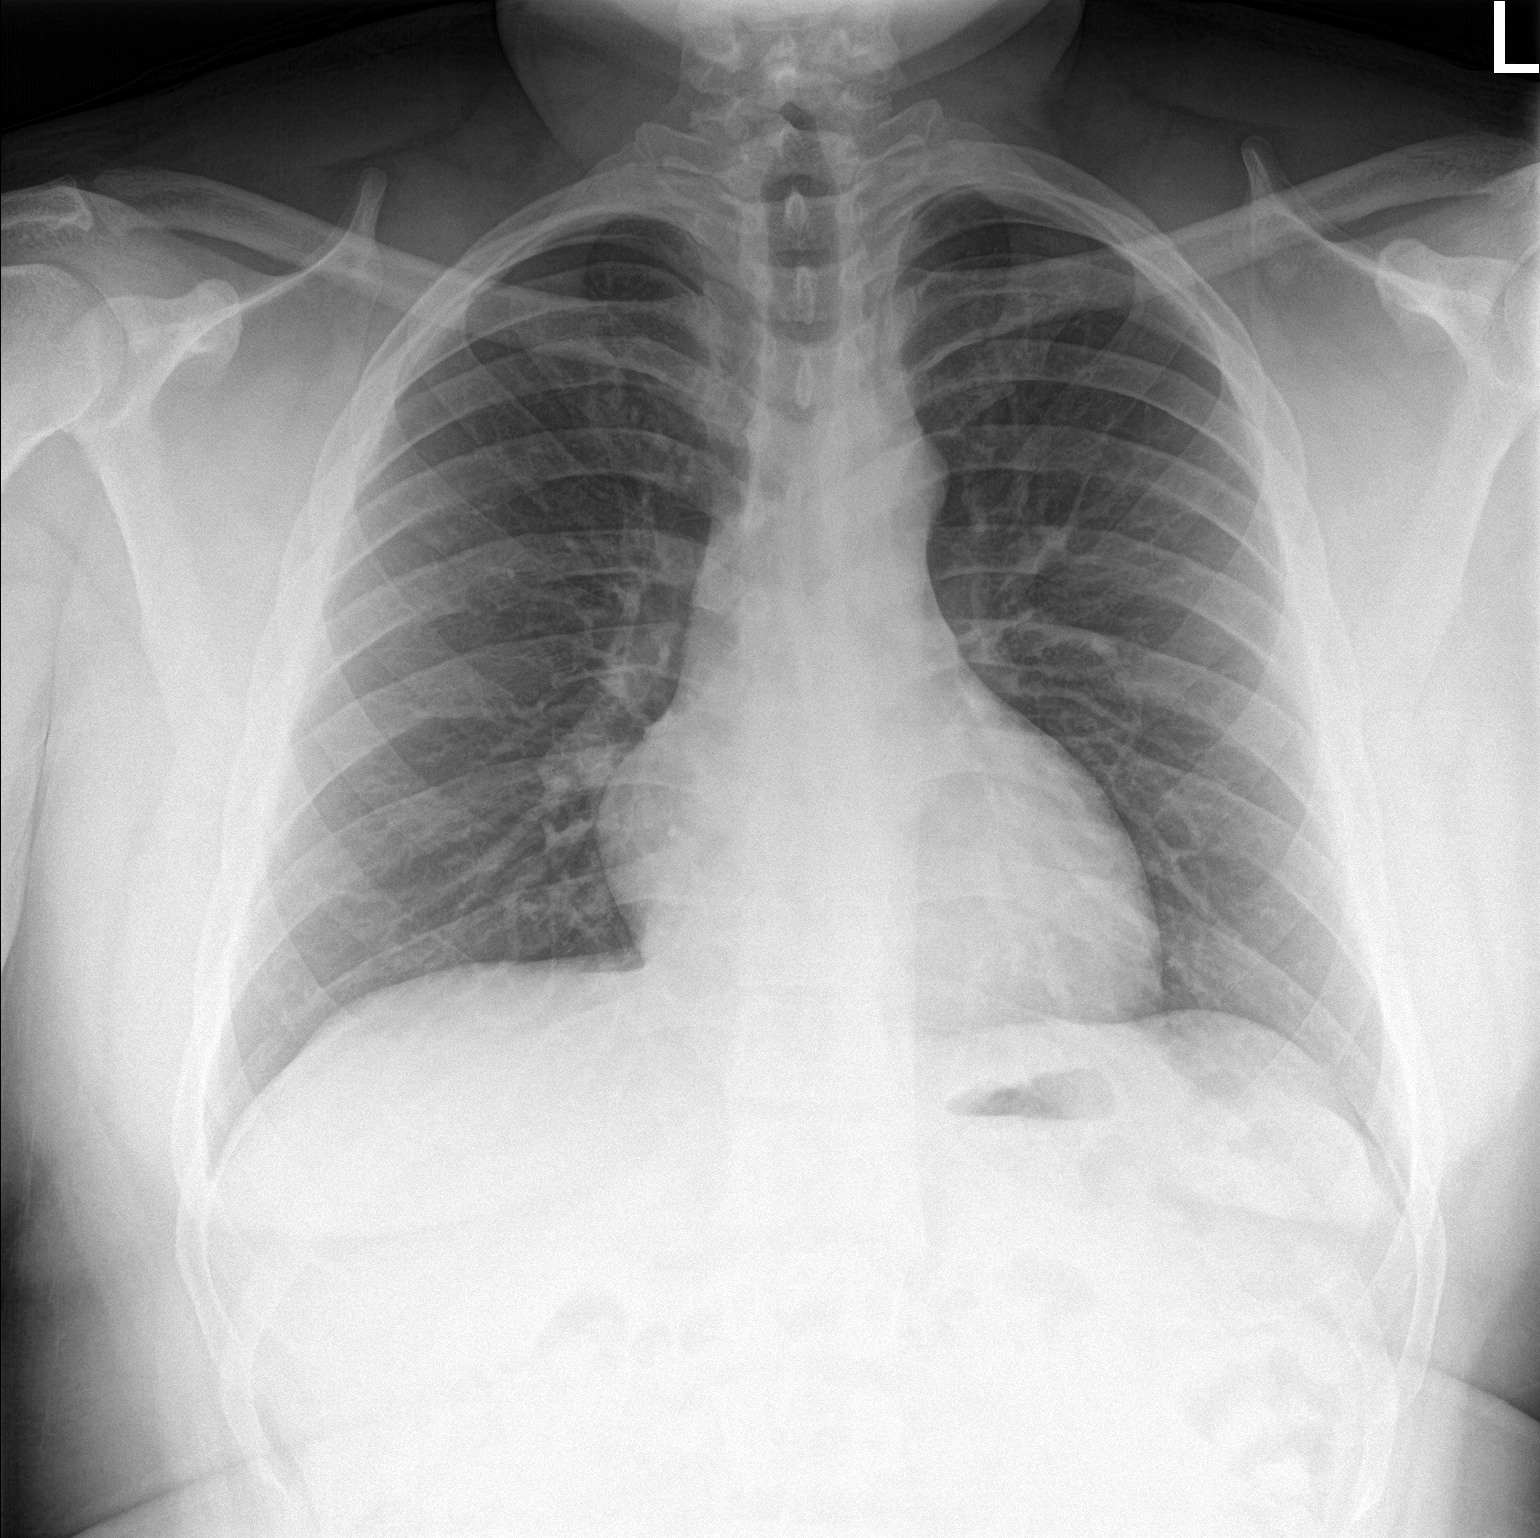

[chest lat]
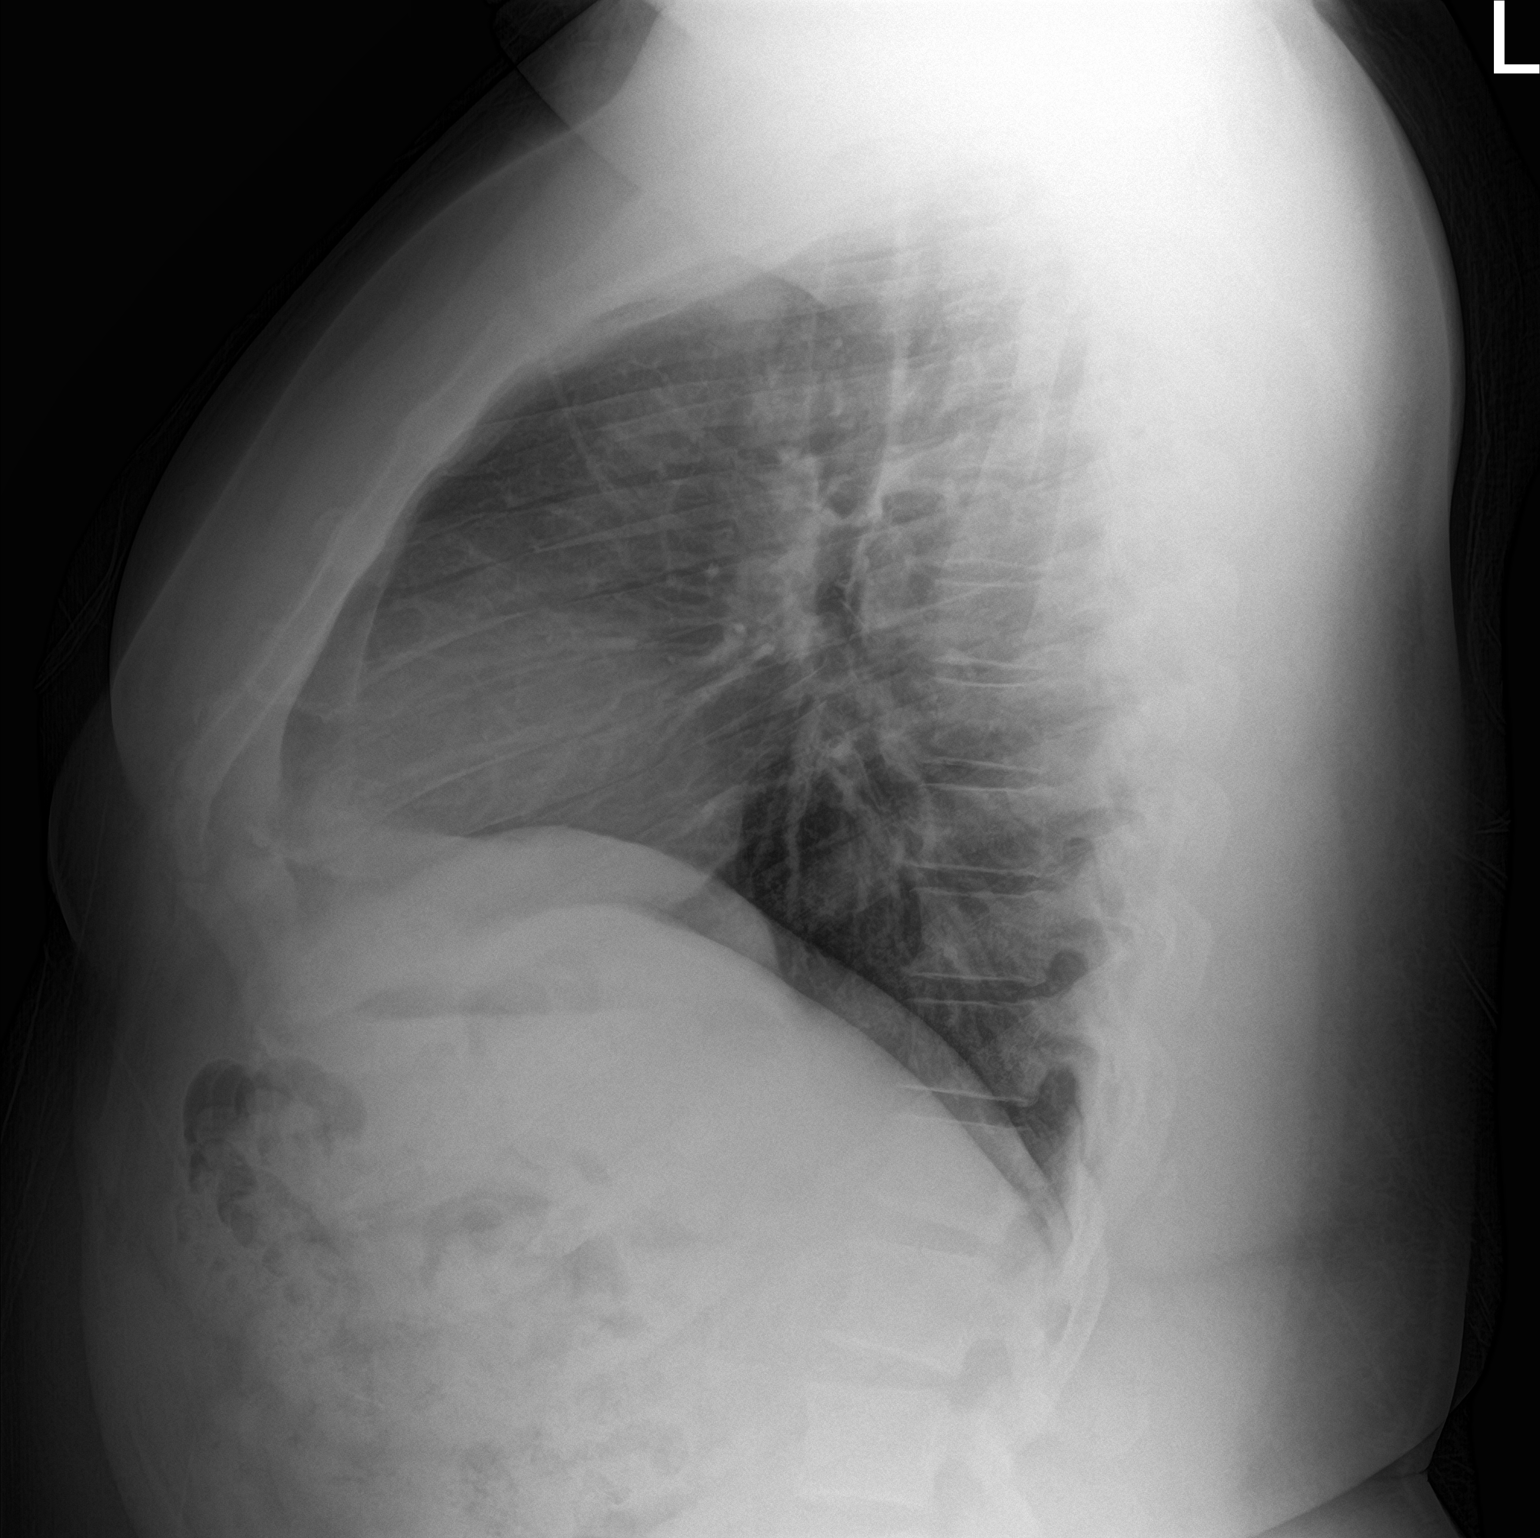

[2 of 2 positions shown; findings below may reference images not displayed]

FINDINGS: The lungs are well-aerated and clear. There is no evidence of focal
opacification, pleural effusion or pneumothorax.

The heart is normal in size; the mediastinal contour is within
normal limits. No acute osseous abnormalities are seen.
IMPRESSION: No acute cardiopulmonary process seen.

## 2018-12-03 DIAGNOSIS — K912 Postsurgical malabsorption, not elsewhere classified: Secondary | ICD-10-CM | POA: Diagnosis not present

## 2018-12-03 DIAGNOSIS — E669 Obesity, unspecified: Secondary | ICD-10-CM | POA: Diagnosis not present

## 2018-12-03 DIAGNOSIS — Z713 Dietary counseling and surveillance: Secondary | ICD-10-CM | POA: Diagnosis not present

## 2018-12-03 DIAGNOSIS — Z9884 Bariatric surgery status: Secondary | ICD-10-CM | POA: Diagnosis not present

## 2018-12-03 DIAGNOSIS — Z6830 Body mass index (BMI) 30.0-30.9, adult: Secondary | ICD-10-CM | POA: Diagnosis not present

## 2019-01-26 DIAGNOSIS — E663 Overweight: Secondary | ICD-10-CM | POA: Diagnosis not present

## 2019-01-26 DIAGNOSIS — Z9884 Bariatric surgery status: Secondary | ICD-10-CM | POA: Diagnosis not present

## 2019-01-26 DIAGNOSIS — Z713 Dietary counseling and surveillance: Secondary | ICD-10-CM | POA: Diagnosis not present

## 2019-01-26 DIAGNOSIS — Z48815 Encounter for surgical aftercare following surgery on the digestive system: Secondary | ICD-10-CM | POA: Diagnosis not present

## 2019-01-26 DIAGNOSIS — Z6828 Body mass index (BMI) 28.0-28.9, adult: Secondary | ICD-10-CM | POA: Diagnosis not present

## 2021-02-05 ENCOUNTER — Encounter (HOSPITAL_COMMUNITY): Payer: Self-pay | Admitting: *Deleted

## 2021-02-05 ENCOUNTER — Emergency Department (HOSPITAL_COMMUNITY)
Admission: EM | Admit: 2021-02-05 | Discharge: 2021-02-05 | Disposition: A | Discharge status: Home / Self Care | Payer: BC Managed Care – PPO | Attending: Emergency Medicine | Admitting: Emergency Medicine

## 2021-02-05 ENCOUNTER — Other Ambulatory Visit: Payer: Self-pay

## 2021-02-05 DIAGNOSIS — U071 COVID-19: Secondary | ICD-10-CM | POA: Insufficient documentation

## 2021-02-05 DIAGNOSIS — R509 Fever, unspecified: Secondary | ICD-10-CM | POA: Diagnosis present

## 2021-02-05 LAB — RESP PANEL BY RT-PCR (FLU A&B, COVID) ARPGX2
Influenza A by PCR: NEGATIVE
Influenza B by PCR: NEGATIVE
SARS Coronavirus 2 by RT PCR: POSITIVE — AB

## 2021-02-05 MED ORDER — BENZONATATE 200 MG PO CAPS: 200.0000 mg | ORAL_CAPSULE | Freq: Three times a day (TID) | ORAL | 0 refills | Status: AC | PRN

## 2021-02-05 NOTE — ED Provider Notes (Signed)
[facility] EMERGENCY DEPARTMENT Provider Note   CSN: [id number] Arrival date & time: 02/05/21  [id number]     History Chief Complaint  Patient presents with   Fever    Jonathan Fuentes is a 40 y.o. male.   Fever Associated symptoms: congestion, cough, headaches, myalgias and rhinorrhea   Associated symptoms: no chest pain, no chills, no confusion, no diarrhea, no dysuria, no nausea, no rash, no sore throat and no vomiting        Jonathan Fuentes is a 40 y.o. male who presents to the Emergency Department complaining of generalized body aches, cough, sneezing, frontal headache and fever.  Symptoms began last evening.  His son is also here for evaluation and has similar symptoms.  He took Tylenol at 4 AM this morning with mild relief of his body aches.  He describes the headache as frontal in location and throbbing in quality.  Cough has been intermittently productive of yellow sputum.  He denies any abdominal pain, vomiting or diarrhea.  No sore throat, shortness of breath or dysuria.  Reports having COVID in the past.  Has not been vaccinated for flu this season.  No recent seizures, does not take any antiseizure medication.    Past Medical History:  Diagnosis Date   Seizures ([place])    last one was in 2010    There are no problems to display for this patient.   Past Surgical History:  Procedure Laterality Date   GASTRIC BYPASS         Family History  Problem Relation Age of Onset   Hypertension Father    Diabetes Father     Social History   Tobacco Use   Smoking status: Never   Smokeless tobacco: Never  Vaping Use   Vaping Use: Never used  Substance Use Topics   Alcohol use: Yes    Comment: socially   Drug use: No    Home Medications Prior to Admission medications   Medication Sig Start Date End Date Taking? Authorizing Provider  ciprofloxacin (CIPRO) 500 MG tablet Take 1 tablet (500 mg total) by mouth 2 (two) times daily. Patient not taking: Reported on  02/05/2021 10/22/18   Robinson, [place] N, PA-C  ondansetron (ZOFRAN ODT) 4 MG disintegrating tablet Take 1 tablet (4 mg total) by mouth every 8 (eight) hours as needed for nausea or vomiting. Patient not taking: Reported on 02/05/2021 10/21/18   Robinson, [place] N, PA-C  oxyCODONE-acetaminophen (PERCOCET/ROXICET) 5-325 MG tablet Take 1 tablet by mouth every 6 (six) hours as needed for severe pain. Patient not taking: Reported on 02/05/2021 10/21/18   Robinson, [place] N, PA-C    Allergies    Penicillins and Tegretol [carbamazepine]  Review of Systems   Review of Systems  Constitutional:  Positive for fever. Negative for chills and fatigue.  HENT:  Positive for congestion, rhinorrhea and sneezing. Negative for sore throat and trouble swallowing.   Eyes:  Negative for pain and visual disturbance.  Respiratory:  Positive for cough. Negative for shortness of breath and wheezing.   Cardiovascular:  Negative for chest pain and palpitations.  Gastrointestinal:  Negative for abdominal pain, diarrhea, nausea and vomiting.  Genitourinary:  Negative for decreased urine volume, dysuria, flank pain and hematuria.  Musculoskeletal:  Positive for myalgias. Negative for arthralgias, back pain, neck pain and neck stiffness.  Skin:  Negative for rash.  Neurological:  Positive for headaches. Negative for dizziness, seizures, weakness and numbness.  Hematological:  Does not bruise/bleed easily.  Psychiatric/Behavioral:  Negative for confusion.   All other systems reviewed and are negative.  Physical Exam Updated Vital Signs BP 136/68 (BP Location: Right Arm)    Pulse 68    Temp 99.4 F (37.4 C) (Oral)    Resp 20    Ht 5\' 11"  (1.803 m)    Wt 91.2 kg    SpO2 100%    BMI 28.03 kg/m   Physical Exam Vitals reviewed.  Constitutional:      General: He is not in acute distress.    Appearance: Normal appearance. He is not toxic-appearing.  HENT:     Head: Normocephalic.     Right Ear: Tympanic membrane and ear  canal normal.     Left Ear: Tympanic membrane and ear canal normal.     Mouth/Throat:     Mouth: Mucous membranes are moist.     Pharynx: Oropharynx is clear. No oropharyngeal exudate or posterior oropharyngeal erythema.  Eyes:     Conjunctiva/sclera: Conjunctivae normal.     Pupils: Pupils are equal, round, and reactive to light.  Neck:     Thyroid: No thyromegaly.     Meningeal: Kernig's sign absent.  Cardiovascular:     Rate and Rhythm: Normal rate and regular rhythm.     Pulses: Normal pulses.  Pulmonary:     Effort: Pulmonary effort is normal.     Breath sounds: Normal breath sounds. No wheezing.  Abdominal:     Palpations: Abdomen is soft.     Tenderness: There is no abdominal tenderness.  Musculoskeletal:        General: Normal range of motion.     Cervical back: Normal range of motion and neck supple.  Skin:    General: Skin is warm.     Findings: No rash.  Neurological:     General: No focal deficit present.     Mental Status: He is alert.     Sensory: No sensory deficit.     Motor: No weakness.    ED Results / Procedures / Treatments   Labs (all labs ordered are listed, but only abnormal results are displayed) Labs Reviewed  RESP PANEL BY RT-PCR (FLU A&B, COVID) ARPGX2 - Abnormal; Notable for the following components:      Result Value   SARS Coronavirus 2 by RT PCR POSITIVE (*)    All other components within normal limits    EKG None  Radiology No results found.  Procedures Procedures   Medications Ordered in ED Medications - No data to display  ED Course  I have reviewed the triage vital signs and the nursing notes.  Pertinent labs & imaging results that were available during my care of the patient were reviewed by me and considered in my medical decision making (see chart for details).    MDM Rules/Calculators/A&P                          Patient is here with 1 day history of cough, generalized body aches, sneezing, headache and rhinorrhea.   His son also here with similar symptoms.  Reports subjective fever at home, has been taking Tylenol with intermittent relief.  On exam, patient well-appearing nontoxic.  Vital signs reassuring.  Low-grade fever here of 99.4 no tachycardia tachypnea or hypoxia.  Increased work of breathing on exam.  Symptoms felt to be related to viral process.  Differential would include pneumonia, meningitis, strep.  Felt to be less likely given absence of lung findings  on physical exam and absence of neck pain or nuchal rigidity.  No significant sore throat or erythema edema of the oropharynx to suggest strep.  Will obtain viral testing.  On recheck, patient resting comfortably no acute distress.  No increased work of breathing.  COVID test positive, influenza negative.  I feel that patient is appropriate for discharge home discussed symptomatic treatment with antipyretics.  Will prescribe Tessalon for his cough.  He is agreeable to close outpatient follow-up and return precautions were also given.      Final Clinical Impression(s) / ED Diagnoses Final diagnoses:  COVID    Rx / DC Orders ED Discharge Orders     None        Pauline Aus, PA-C 02/05/21 1208    Terald Sleeper, MD 02/05/21 [phone]

## 2021-02-05 NOTE — ED Triage Notes (Addendum)
Pt c/o cough, fever, body aches, headache that started last night around 2000. Tylenol taken around 0400 this morning.

## 2021-02-05 NOTE — Discharge Instructions (Signed)
Your COVID test today was positive.  I recommend that you get plenty of rest, alternate Tylenol and ibuprofen every 4 and 6 hours respectively for fever and body aches.  You will need to isolate at home for 5 days, on day 5 if you are feeling better and have been at least 24 hours without fever and without need for fever reducing medications you may return to normal activities but continue to wear a mask for 5 additional days.  Follow-up with your primary care provider for recheck.  Return emergency department for any new or worsening symptoms.
